# Patient Record
Sex: Male | Born: 1970 | Race: White | Hispanic: No | Smoking: Current every day smoker
Health system: Southern US, Community
[De-identification: ages and names within clinical notes are randomized; demographics above are authoritative.]

## PROBLEM LIST (undated history)

## (undated) HISTORY — PX: APPENDECTOMY: SHX54

---

## 2013-12-16 ENCOUNTER — Emergency Department (HOSPITAL_COMMUNITY)
Admission: EM | Admit: 2013-12-16 | Discharge: 2013-12-16 | Disposition: A | Discharge status: Home / Self Care | Payer: Self-pay | Attending: Emergency Medicine | Admitting: Emergency Medicine

## 2013-12-16 ENCOUNTER — Emergency Department (HOSPITAL_COMMUNITY): Payer: Self-pay

## 2013-12-16 ENCOUNTER — Encounter (HOSPITAL_COMMUNITY): Payer: Self-pay | Admitting: Emergency Medicine

## 2013-12-16 DIAGNOSIS — S81811A Laceration without foreign body, right lower leg, initial encounter: Secondary | ICD-10-CM

## 2013-12-16 DIAGNOSIS — S81009A Unspecified open wound, unspecified knee, initial encounter: Secondary | ICD-10-CM | POA: Insufficient documentation

## 2013-12-16 DIAGNOSIS — S62101A Fracture of unspecified carpal bone, right wrist, initial encounter for closed fracture: Secondary | ICD-10-CM

## 2013-12-16 DIAGNOSIS — Y929 Unspecified place or not applicable: Secondary | ICD-10-CM | POA: Insufficient documentation

## 2013-12-16 DIAGNOSIS — Y939 Activity, unspecified: Secondary | ICD-10-CM | POA: Insufficient documentation

## 2013-12-16 DIAGNOSIS — S52599A Other fractures of lower end of unspecified radius, initial encounter for closed fracture: Secondary | ICD-10-CM | POA: Insufficient documentation

## 2013-12-16 DIAGNOSIS — S91009A Unspecified open wound, unspecified ankle, initial encounter: Secondary | ICD-10-CM

## 2013-12-16 DIAGNOSIS — W11XXXA Fall on and from ladder, initial encounter: Secondary | ICD-10-CM | POA: Insufficient documentation

## 2013-12-16 DIAGNOSIS — F17200 Nicotine dependence, unspecified, uncomplicated: Secondary | ICD-10-CM | POA: Insufficient documentation

## 2013-12-16 DIAGNOSIS — S81809A Unspecified open wound, unspecified lower leg, initial encounter: Secondary | ICD-10-CM

## 2013-12-16 MED ORDER — ONDANSETRON HCL 4 MG PO TABS
4.0000 mg | ORAL_TABLET | Freq: Once | ORAL | Status: AC
  Administered 2013-12-16: 4 mg via ORAL
  Filled 2013-12-16: qty 1

## 2013-12-16 MED ORDER — DICLOFENAC SODIUM 75 MG PO TBEC: 75.0000 mg | DELAYED_RELEASE_TABLET | Freq: Two times a day (BID) | ORAL | Status: DC

## 2013-12-16 MED ORDER — OXYCODONE-ACETAMINOPHEN 5-325 MG PO TABS
1.0000 | ORAL_TABLET | Freq: Once | ORAL | Status: AC
  Administered 2013-12-16: 1 via ORAL
  Filled 2013-12-16: qty 1

## 2013-12-16 MED ORDER — KETOROLAC TROMETHAMINE 10 MG PO TABS
10.0000 mg | ORAL_TABLET | Freq: Once | ORAL | Status: AC
  Administered 2013-12-16: 10 mg via ORAL
  Filled 2013-12-16: qty 1

## 2013-12-16 MED ORDER — OXYCODONE-ACETAMINOPHEN 5-325 MG PO TABS: 1.0000 | ORAL_TABLET | Freq: Four times a day (QID) | ORAL | Status: DC | PRN

## 2013-12-16 MED ORDER — BACITRACIN-NEOMYCIN-POLYMYXIN 400-5-5000 EX OINT
TOPICAL_OINTMENT | Freq: Once | CUTANEOUS | Status: AC
  Administered 2013-12-16: 19:00:00 via TOPICAL
  Filled 2013-12-16: qty 1

## 2013-12-16 NOTE — Discharge Instructions (Signed)
Your x-ray reveals a fracture of the right wrist. Please leave the splint in place until seen by orthopedics. It is important that you are evaluated by orthopedics for proper healing of this fracture. Please apply ice over the next few days. Please use the sling to keep the arm elevated. Please use diclofenac 2 times daily with food for inflammation and swelling. May use Percocet if needed for pain. Percocet may cause pain, and or constipation. Please use with caution. Please see Dr Hilda LiasKeeling on 7/22 or 23.  Wrist Fracture A wrist fracture is a break in one of the bones of the wrist. Your wrist is made up of several small bones at the palm of your hand (carpal bones) and the two bones that make up your forearm (radius and ulna). The bones come together to form multiple large and small joints. The shape and design of these joints allow your wrist to bend and straighten, move side-to-side, and rotate, as in twisting your palm up or down. CAUSES  A fracture may occur in any of the bones in your wrist when enough force is applied to the wrist, such as when falling down onto an outstretched hand. Severe injuries may occur from a more forceful injury. SYMPTOMS Symptoms of wrist fractures include tenderness, bruising, and swelling. Additionally, the wrist may hang in an odd position or may be misshaped. DIAGNOSIS To diagnose a wrist fracture, your caregiver will physically examine your wrist. Your caregiver may also request an X-ray exam of your wrist. TREATMENT Treatment depends on many factors, including the nature and location of the fracture, your age, and your activity level. Treatment for wrist fracture can be nonsurgical or surgical. For nonsurgical treatment, a plaster cast or splint may be applied to your wrist if the bone is in a good position (aligned). The cast will stay on for about 6 weeks. If the alignment of your bone is not good, it may be necessary to realign (reduce) it. After the bone is  reduced, a splint usually is placed on your wrist to allow for a small amount of normal swelling. After about 1 week, the splint is removed and a cast is added. The cast is removed 2 or 3 weeks later, after the swelling goes down, causing the cast to loosen. Another cast is applied. This cast is removed after about another 2 or 3 weeks, for a total of 4 to 6 weeks of immobilization. Sometimes the position of the bone is so far out of place that surgery is required to apply a device to hold it together as it heals. If the bone cannot be reduced without cutting the skin around the bone (closed reduction), a cut (incision) is made to allow direct access to the bone to reduce it (open reduction). Depending on the fracture, there are a number of options for holding the bone in place while it heals, including a cast, metal pins, a plate and screws, and a device called an external fixator. With an external fixator, most of the hardware remains outside of the body. HOME CARE INSTRUCTIONS  To lessen swelling, keep your injured wrist elevated and move your fingers as much as possible.  Apply ice to your wrist for the first 1 to 2 days after you have been treated or as directed by your caregiver. Applying ice helps to reduce inflammation and pain.  Put ice in a plastic bag.  Place a towel between your skin and the bag.  Leave the ice on for 15  to 20 minutes at a time, every 2 hours while you are awake.  Do not put pressure on any part of your cast or splint. It may break.  Use a plastic bag to protect your cast or splint from water while bathing or showering. Do not lower your cast or splint into water.  Only take over-the-counter or prescription medicines for pain as directed by your caregiver. SEEK IMMEDIATE MEDICAL CARE IF:   Your cast or splint gets damaged or breaks.  You have continued severe pain or more swelling than you did before the cast was put on.  Your skin or fingernails below the injury  turn blue or gray or feel cold or numb.  You develop decreased feeling in your fingers. MAKE SURE YOU:  Understand these instructions.  Will watch your condition.  Will get help right away if you are not doing well or get worse. Document Released: 02/22/2005 Document Revised: 08/07/2011 Document Reviewed: 06/02/2011 [facility] Patient Information 2015 [place], [place]. This information is not intended to replace advice given to you by your health care provider. Make sure you discuss any questions you have with your health care provider.

## 2013-12-16 NOTE — ED Provider Notes (Signed)
CSN: [id number]     Arrival date & time 12/16/13  1657 History  This chart was scribed for Hurman Horn, MD,  by Ashley Jacobs, ED Scribe. The patient was seen in room APFT24/APFT24 and the patient's care was started at 5:58 PM.   First MD Initiated Contact with Patient 12/16/13 1736     Chief Complaint  Patient presents with  . Wrist Injury     (Consider location/radiation/quality/duration/timing/severity/associated sxs/prior Treatment) Patient is a 43 y.o. male presenting with wrist injury. The history is provided by the patient and medical records. No language interpreter was used.  Wrist Injury Location:  Wrist Injury: yes   Mechanism of injury: fall   Fall:    Fall occurred:  From a ladder   Entrapped after fall: no   Wrist location:  R wrist Foreign body present:  No foreign bodies Worsened by:  Movement Ineffective treatments:  None tried  HPI Comments: Bryce Crawford is a 43 y.o. male who presents to the Emergency Department complaining of wrist pain and swelling onset earlier today after falling off a ladder. The pain is worse with movement.  Denies LOC and head injury. Pt mentions having a laceration to his right knee. Nothing seems to make the pain better. Denies taking any blood thinners. His last tetanus shot was within the last five years. Pt does not have a current orthopedist. Pt does not have any known allergies to medications.  History reviewed. No pertinent past medical history. Past Surgical History  Procedure Laterality Date  . Appendectomy     History reviewed. No pertinent family history. History  Substance Use Topics  . Smoking status: Current Every Day Smoker  . Smokeless tobacco: Not on file  . Alcohol Use: No    Review of Systems  Musculoskeletal: Positive for arthralgias, joint swelling and myalgias.  Skin: Positive for wound.  Neurological: Negative for numbness.  Hematological: Does not bruise/bleed easily.  All other systems reviewed and  are negative.     Allergies  Review of patient's allergies indicates no known allergies.  Home Medications   Prior to Admission medications   Not on File   BP 125/83  Pulse 77  Temp(Src) 98.7 F (37.1 C) (Oral)  Resp 18  SpO2 97% Physical Exam  Nursing note and vitals reviewed. Constitutional: He is oriented to person, place, and time. He appears well-developed and well-nourished.  HENT:  Head: Normocephalic.  Eyes: Pupils are equal, round, and reactive to light.  Neck: Normal range of motion.  Cardiovascular: Normal rate.   Pulmonary/Chest: Effort normal.  Musculoskeletal: He exhibits tenderness.  Full ROM of the finger of the right hand. Capillary refill is less than 5 seconds.  Radial pulse is 2+. Tenderness to the distal radius.  Mild deformity of the distal radius.  No deformity of the ulnar area.  No deformity of the right forearm.  No deformity or effusion of the elbow.    Neurological: He is alert and oriented to person, place, and time.  Skin: Skin is warm and dry. He is not diaphoretic.  8 cm laceration/ abrasion of the right lower leg of the medial knee area.     ED Course FRACTURE CARE.   Patient sustained a fall from a ladder earlier today. His x-ray reveals a right distal radius fracture. The x-ray findings of the fracture were shared with the patient in terms which he understands. The procedure for splinting was then described to the patient in terms which he understood. Questions  answered.  The patient is noted to have 2+ radial pulse and capillary refill is less than 2 seconds. There no gross neurologic deficits appreciated. And sugar tong splint applied. Sling applied. Ice pack and pain medication provided. After the sugar tong was applied, there no temperature changes or discoloration appreciated. The capillary refill remains less than 2 seconds. The patient tolerated the procedure without problem.   Procedures (including critical care  time) DIAGNOSTIC STUDIES: Oxygen Saturation is 97% on room air, normal by my interpretation.    COORDINATION OF CARE:  6:04 PM Discussed course of care with pt which includes a consult with ortho surgeon. Pt understands and agrees.   Labs Review Labs Reviewed - No data to display  Imaging Review No results found.   EKG Interpretation None      MDM Patient sustained a fall from a ladder and injury to the right wrist. X-ray reveals a nondisplaced fracture of the distal radius. There no neurovascular compromise is appreciated. The patient has a sugar tong splint and sling applied. He will followup with Dr. Hilda LiasKeeling on July 22 or 23rd. Prescription for diclofenac and Percocet given to the patient.  The laceration to the right lower extremity is not a candidate for suture repair. The patient will cleanse the wound daily with soap and water, and apply nonstick dressing until the wound heals.    Final diagnoses:  None    *I have reviewed nursing notes, vital signs, and all appropriate lab and imaging results for this patient.** **I personally performed the services described in this documentation, which was scribed in my presence. The recorded information has been reviewed and is accurate.Kathie Dike*   Kamsiyochukwu Buist M Anik Wesch, PA-C 12/16/13 1821  Kathie DikeHobson M Ariani Seier, PA-C 12/16/13 Rickey [id number]

## 2013-12-16 NOTE — ED Notes (Signed)
Pt c/o right wrist pain after falling off a ladder earlier today. Pt denies hitting head, denies LOC. Pt has minimal movement with right fingers. Swelling noted to right wrist. Pt also reports "a gash" to his right knee.

## 2013-12-16 NOTE — ED Notes (Signed)
Pt verbalized understanding of no driving within 4 hours of taking pain med due to med causes drowsiness  

## 2013-12-20 ENCOUNTER — Emergency Department (HOSPITAL_COMMUNITY)
Admission: EM | Admit: 2013-12-20 | Discharge: 2013-12-20 | Disposition: A | Discharge status: Home / Self Care | Payer: Self-pay | Attending: Emergency Medicine | Admitting: Emergency Medicine

## 2013-12-20 ENCOUNTER — Emergency Department (HOSPITAL_COMMUNITY): Payer: Self-pay

## 2013-12-20 ENCOUNTER — Encounter (HOSPITAL_COMMUNITY): Payer: Self-pay | Admitting: Emergency Medicine

## 2013-12-20 DIAGNOSIS — M25519 Pain in unspecified shoulder: Secondary | ICD-10-CM | POA: Insufficient documentation

## 2013-12-20 DIAGNOSIS — F17200 Nicotine dependence, unspecified, uncomplicated: Secondary | ICD-10-CM | POA: Insufficient documentation

## 2013-12-20 DIAGNOSIS — M25579 Pain in unspecified ankle and joints of unspecified foot: Secondary | ICD-10-CM | POA: Insufficient documentation

## 2013-12-20 DIAGNOSIS — S91009A Unspecified open wound, unspecified ankle, initial encounter: Secondary | ICD-10-CM

## 2013-12-20 DIAGNOSIS — S81009A Unspecified open wound, unspecified knee, initial encounter: Secondary | ICD-10-CM | POA: Insufficient documentation

## 2013-12-20 DIAGNOSIS — Z792 Long term (current) use of antibiotics: Secondary | ICD-10-CM | POA: Insufficient documentation

## 2013-12-20 DIAGNOSIS — M25561 Pain in right knee: Secondary | ICD-10-CM

## 2013-12-20 DIAGNOSIS — Y929 Unspecified place or not applicable: Secondary | ICD-10-CM | POA: Insufficient documentation

## 2013-12-20 DIAGNOSIS — Y939 Activity, unspecified: Secondary | ICD-10-CM | POA: Insufficient documentation

## 2013-12-20 DIAGNOSIS — M25511 Pain in right shoulder: Secondary | ICD-10-CM

## 2013-12-20 DIAGNOSIS — G8911 Acute pain due to trauma: Secondary | ICD-10-CM | POA: Insufficient documentation

## 2013-12-20 DIAGNOSIS — S81809A Unspecified open wound, unspecified lower leg, initial encounter: Secondary | ICD-10-CM

## 2013-12-20 DIAGNOSIS — Z23 Encounter for immunization: Secondary | ICD-10-CM | POA: Insufficient documentation

## 2013-12-20 DIAGNOSIS — M79671 Pain in right foot: Secondary | ICD-10-CM

## 2013-12-20 DIAGNOSIS — M25571 Pain in right ankle and joints of right foot: Secondary | ICD-10-CM

## 2013-12-20 DIAGNOSIS — W11XXXA Fall on and from ladder, initial encounter: Secondary | ICD-10-CM | POA: Insufficient documentation

## 2013-12-20 DIAGNOSIS — Z791 Long term (current) use of non-steroidal anti-inflammatories (NSAID): Secondary | ICD-10-CM | POA: Insufficient documentation

## 2013-12-20 MED ORDER — TETANUS-DIPHTH-ACELL PERTUSSIS 5-2.5-18.5 LF-MCG/0.5 IM SUSP
0.5000 mL | Freq: Once | INTRAMUSCULAR | Status: AC
  Administered 2013-12-20: 0.5 mL via INTRAMUSCULAR
  Filled 2013-12-20: qty 0.5

## 2013-12-20 MED ORDER — SULFAMETHOXAZOLE-TMP DS 800-160 MG PO TABS
1.0000 | ORAL_TABLET | Freq: Once | ORAL | Status: AC
  Administered 2013-12-20: 1 via ORAL
  Filled 2013-12-20: qty 1

## 2013-12-20 MED ORDER — SULFAMETHOXAZOLE-TRIMETHOPRIM 800-160 MG PO TABS: 1.0000 | ORAL_TABLET | Freq: Two times a day (BID) | ORAL | Status: DC

## 2013-12-20 MED ORDER — NAPROXEN 500 MG PO TABS: 500.0000 mg | ORAL_TABLET | Freq: Two times a day (BID) | ORAL | Status: DC

## 2013-12-20 NOTE — ED Provider Notes (Signed)
CSN: [id number]     Arrival date & time 12/20/13  2000 History   First MD Initiated Contact with Patient 12/20/13 2038    This chart was scribed for Donnetta HutchingBrian Rihan Schueler, MD by Marica OtterNusrat Rahman, ED Scribe. This patient was seen in room APA09/APA09 and the patient's care was started at 8:46 PM.  Chief Complaint  Patient presents with  . Ankle Pain  PCP: No PCP Per Patient   The history is provided by the patient. No language interpreter was used.   HPI Comments: Bryce Crawford is a 43 y.o. male who presents to the Emergency Department complaining of right wrist pain due to a fall sustained on 12/16/13. Pt now complains of swelling and pain to his right ankle. Pt rates his ankle pain a 10 out of 10. However, pt notes that he has been ambulatory since the fall. Pt further reports that he is unsure whether he is UTD on his tetanus vaccines.   Pt presented to the ED on 12/16/13 where imaging revealed nondisplaced fracture of the distal radius; and a sugar tong splint and sling were applied. During said ED visit, pt was also referred to Dr. Hilda LiasKeeling for a f/u appointment on 12/17/13 or 12/18/13. Pt was also given percocet during said ED visit. Pt reports he did not f/u with Dr. Hilda LiasKeeling due to costs.  History reviewed. No pertinent past medical history. Past Surgical History  Procedure Laterality Date  . Appendectomy     No family history on file. History  Substance Use Topics  . Smoking status: Current Every Day Smoker  . Smokeless tobacco: Not on file  . Alcohol Use: No    Review of Systems A complete 10 system review of systems was obtained and all systems are negative except as noted in the HPI and PMH.     Allergies  Review of patient's allergies indicates no known allergies.  Home Medications   Prior to Admission medications   Medication Sig Start Date End Date Taking? Authorizing Provider  diclofenac (VOLTAREN) 75 MG EC tablet Take 1 tablet (75 mg total) by mouth 2 (two) times daily. 12/16/13   Yes Kathie DikeHobson M Bryant, PA-C  oxyCODONE-acetaminophen (PERCOCET/ROXICET) 5-325 MG per tablet Take 1 tablet by mouth every 6 (six) hours as needed. 12/16/13  Yes Kathie DikeHobson M Bryant, PA-C  naproxen (NAPROSYN) 500 MG tablet Take 1 tablet (500 mg total) by mouth 2 (two) times daily. 12/20/13   Donnetta HutchingBrian Shondale Quinley, MD  sulfamethoxazole-trimethoprim (SEPTRA DS) 800-160 MG per tablet Take 1 tablet by mouth 2 (two) times daily. 12/20/13   Donnetta HutchingBrian Anairis Knick, MD   Triage Vitals: BP 127/83  Pulse 98  Temp(Src) 98 F (36.7 C) (Oral)  Resp 20  Ht 6\' 4"  (1.93 m)  Wt 184 lb (83.462 kg)  BMI 22.41 kg/m2  SpO2 99% Physical Exam  Nursing note and vitals reviewed. Constitutional: He is oriented to person, place, and time. He appears well-developed and well-nourished.  HENT:  Head: Normocephalic and atraumatic.  Eyes: Conjunctivae and EOM are normal. Pupils are equal, round, and reactive to light.  Neck: Normal range of motion. Neck supple.  Cardiovascular: Normal rate, regular rhythm and normal heart sounds.   Pulmonary/Chest: Effort normal and breath sounds normal.  Abdominal: Soft. Bowel sounds are normal.  Musculoskeletal: He exhibits tenderness.  Tender peri right ankle and dorsum of the right foot.   Neurological: He is alert and oriented to person, place, and time.  Skin: Skin is warm and dry.  Medial aspect of right  knee 6 cm elliptical superfical laceration.  Psychiatric: He has a normal mood and affect. His behavior is normal.    ED Course  Procedures (including critical care time) DIAGNOSTIC STUDIES: Oxygen Saturation is 99% on RA, nl by my interpretation.    COORDINATION OF CARE: 8:50 PM-Discussed treatment plan which includes imaging of the ankle and foot, tetanus shot, pain meds and antibiotics with pt at bedside and pt agreed to plan.   Labs Review Labs Reviewed - No data to display  Imaging Review Dg Shoulder Right  12/20/2013   CLINICAL DATA:  Right shoulder pain, status post fall from ladder.   EXAM: RIGHT SHOULDER - 2+ VIEW  COMPARISON:  None.  FINDINGS: There is no evidence of fracture or dislocation. Slight sclerotic change at the junction of the middle and lateral thirds of the right clavicle is thought to reflect remote injury. The right humeral head is seated within the glenoid fossa. The acromioclavicular joint is unremarkable in appearance. No significant soft tissue abnormalities are seen. The visualized portions of the right lung are clear.  IMPRESSION: No evidence of fracture or dislocation.   Electronically Signed   By: Roanna Raider M.D.   On: 12/20/2013 21:50   Dg Ankle Complete Right  12/20/2013   CLINICAL DATA:  Ankle swelling and bruising.  Fall from ladder.  EXAM: RIGHT ANKLE - COMPLETE 3+ VIEW  COMPARISON:  None.  FINDINGS: No evidence for fracture. No subluxation or dislocation. Ankle mortise is preserved. Soft tissue swelling is evident.  IMPRESSION: Soft tissue swelling without acute bony abnormality.   Electronically Signed   By: Kennith Center M.D.   On: 12/20/2013 21:49   Dg Knee Complete 4 Views Right  12/20/2013   CLINICAL DATA:  43 year old male right knee pain following injury  EXAM: RIGHT KNEE - COMPLETE 4+ VIEW  COMPARISON:  None.  FINDINGS: There is no evidence of acute fracture, subluxation or dislocation.  Chondrocalcinosis identified.  There is no evidence of joint effusion.  No focal bony lesions are identified.  IMPRESSION: No evidence of acute bony abnormality.  Chondrocalcinosis/ CPPD.   Electronically Signed   By: Laveda Abbe M.D.   On: 12/20/2013 21:48   Dg Foot Complete Right  12/20/2013   CLINICAL DATA:  Foot pain after fall from ladder.  Swelling.  EXAM: RIGHT FOOT COMPLETE - 3+ VIEW  COMPARISON:  None.  FINDINGS: There is no evidence of fracture or dislocation. There is no evidence of arthropathy or other focal bone abnormality. Soft tissues are unremarkable.  IMPRESSION: Negative.   Electronically Signed   By: Kennith Center M.D.   On: 12/20/2013 21:50      EKG Interpretation None      MDM   Final diagnoses:  Right ankle pain  Right foot pain  Right knee pain  Right shoulder pain   Plain films of right shoulder, right knee, right ankle, right foot all negative for fracture. Tetanus update. Will start Septra DS for mild cellulitis of right medial knee laceration. Also Rx for Naprosyn 500 mg. Ankle brace applied  I personally performed the services described in this documentation, which was scribed in my presence. The recorded information has been reviewed and is accurate.    Donnetta Hutching, MD 12/20/13 2201

## 2013-12-20 NOTE — ED Provider Notes (Signed)
Medical screening examination/treatment/procedure(s) were performed by non-physician practitioner and as supervising physician I was immediately available for consultation/collaboration.   EKG Interpretation None       Hyland Mollenkopf M Zadyn Yardley, MD 12/20/13 0121 

## 2013-12-20 NOTE — Discharge Instructions (Signed)
All x-rays were negative for fracture. Need to keep wound clean with soap and water. Simple dressing daily. Ice to painful areas. Ankle wrap. Antibiotic and pain medicine

## 2013-12-20 NOTE — ED Notes (Signed)
Seen in ed on 7/21. States he fell off a ladder, patient has a broken right wrist and was referred to Dr Hilda LiasKeeling and does not have the money to go. States he has rewrapped his arm splint and would like a dressing change on his right leg. Today his right ankle is swollen and he is concerned it may be broken

## 2015-04-02 ENCOUNTER — Emergency Department (HOSPITAL_COMMUNITY)
Admission: EM | Admit: 2015-04-02 | Discharge: 2015-04-02 | Disposition: A | Discharge status: Home / Self Care | Payer: Worker's Compensation | Attending: Emergency Medicine | Admitting: Emergency Medicine

## 2015-04-02 ENCOUNTER — Emergency Department (HOSPITAL_COMMUNITY): Payer: Worker's Compensation

## 2015-04-02 ENCOUNTER — Encounter (HOSPITAL_COMMUNITY): Payer: Self-pay | Admitting: Emergency Medicine

## 2015-04-02 DIAGNOSIS — Z720 Tobacco use: Secondary | ICD-10-CM | POA: Diagnosis not present

## 2015-04-02 DIAGNOSIS — Y9389 Activity, other specified: Secondary | ICD-10-CM | POA: Insufficient documentation

## 2015-04-02 DIAGNOSIS — S300XXA Contusion of lower back and pelvis, initial encounter: Secondary | ICD-10-CM | POA: Diagnosis not present

## 2015-04-02 DIAGNOSIS — S0240DA Maxillary fracture, left side, initial encounter for closed fracture: Secondary | ICD-10-CM | POA: Diagnosis not present

## 2015-04-02 DIAGNOSIS — S7011XA Contusion of right thigh, initial encounter: Secondary | ICD-10-CM

## 2015-04-02 DIAGNOSIS — Z792 Long term (current) use of antibiotics: Secondary | ICD-10-CM | POA: Diagnosis not present

## 2015-04-02 DIAGNOSIS — Y9289 Other specified places as the place of occurrence of the external cause: Secondary | ICD-10-CM | POA: Insufficient documentation

## 2015-04-02 DIAGNOSIS — Y998 Other external cause status: Secondary | ICD-10-CM | POA: Diagnosis not present

## 2015-04-02 DIAGNOSIS — S02401A Maxillary fracture, unspecified, initial encounter for closed fracture: Secondary | ICD-10-CM

## 2015-04-02 DIAGNOSIS — S7012XA Contusion of left thigh, initial encounter: Secondary | ICD-10-CM | POA: Diagnosis not present

## 2015-04-02 DIAGNOSIS — Z791 Long term (current) use of non-steroidal anti-inflammatories (NSAID): Secondary | ICD-10-CM | POA: Diagnosis not present

## 2015-04-02 DIAGNOSIS — S0993XA Unspecified injury of face, initial encounter: Secondary | ICD-10-CM | POA: Diagnosis present

## 2015-04-02 MED ORDER — IBUPROFEN 800 MG PO TABS
800.0000 mg | ORAL_TABLET | Freq: Once | ORAL | Status: AC
  Administered 2015-04-02: 800 mg via ORAL
  Filled 2015-04-02: qty 1

## 2015-04-02 MED ORDER — OXYCODONE-ACETAMINOPHEN 5-325 MG PO TABS: 1.0000 | ORAL_TABLET | ORAL | Status: AC | PRN

## 2015-04-02 NOTE — Discharge Instructions (Signed)
Take ibuprofen, naproxen, or acetaminophen for less severe pain.  General Assault Assault includes any behavior or physical attack--whether it is on purpose or not--that results in injury to another person, damage to property, or both. This also includes assault that has not yet happened, but is planned to happen. Threats of assault may be physical, verbal, or written. They may be said or sent by:  Mail.  E-mail.  Text.  Social media.  Fax. The threats may be direct, implied, or understood. WHAT ARE THE DIFFERENT FORMS OF ASSAULT? Forms of assault include:  Physically assaulting a person. This includes physical threats to inflict physical harm as well as:  Slapping.  Hitting.  Poking.  Kicking.  Punching.  Pushing.  Sexually assaulting a person. Sexual assault is any sexual activity that a person is forced, threatened, or coerced to participate in. It may or may not involve physical contact with the person who is assaulting you. You are sexually assaulted if you are forced to have sexual contact of any kind.  Damaging or destroying a person's assistive equipment, such as glasses, canes, or walkers.  Throwing or hitting objects.  Using or displaying a weapon to harm or threaten someone.  Using or displaying an object that appears to be a weapon in a threatening manner.  Using greater physical size or strength to intimidate someone.  Making intimidating or threatening gestures.  Bullying.  Hazing.  Using language that is intimidating, threatening, hostile, or abusive.  Stalking.  Restraining someone with force. WHAT SHOULD I DO IF I EXPERIENCE ASSAULT?  Report assaults, threats, and stalking to the police. Call your local emergency services (911 in the [place] if you are in immediate danger or you need medical help.  You can work with a Clinical research associate or an advocate to get legal protection against someone who has assaulted you or threatened you with assault. Protection  includes restraining orders and private addresses. Crimes against you, such as assault, can also be prosecuted through the courts. Laws will vary depending on where you live.   This information is not intended to replace advice given to you by your health care provider. Make sure you discuss any questions you have with your health care provider.   Document Released: 05/15/2005 Document Revised: 06/05/2014 Document Reviewed: 01/30/2014 Elsevier Interactive Patient Education 2016 [employer]  Contusion A contusion is a deep bruise. Contusions are the result of a blunt injury to tissues and muscle fibers under the skin. The injury causes bleeding under the skin. The skin overlying the contusion may turn blue, purple, or yellow. Minor injuries will give you a painless contusion, but more severe contusions may stay painful and swollen for a few weeks.  CAUSES  This condition is usually caused by a blow, trauma, or direct force to an area of the body. SYMPTOMS  Symptoms of this condition include:  Swelling of the injured area.  Pain and tenderness in the injured area.  Discoloration. The area may have redness and then turn blue, purple, or yellow. DIAGNOSIS  This condition is diagnosed based on a physical exam and medical history. An X-ray, CT scan, or MRI may be needed to determine if there are any associated injuries, such as broken bones (fractures). TREATMENT  Specific treatment for this condition depends on what area of the body was injured. In general, the best treatment for a contusion is resting, icing, applying pressure to (compression), and elevating the injured area. This is often called the RICE strategy. Over-the-counter anti-inflammatory medicines  may also be recommended for pain control.  HOME CARE INSTRUCTIONS   Rest the injured area.  If directed, apply ice to the injured area:  Put ice in a plastic bag.  Place a towel between your skin and the bag.  Leave the ice on  for 20 minutes, 2-3 times per day.  If directed, apply light compression to the injured area using an elastic bandage. Make sure the bandage is not wrapped too tightly. Remove and reapply the bandage as directed by your health care provider.  If possible, raise (elevate) the injured area above the level of your heart while you are sitting or lying down.  Take over-the-counter and prescription medicines only as told by your health care provider. SEEK MEDICAL CARE IF:  Your symptoms do not improve after several days of treatment.  Your symptoms get worse.  You have difficulty moving the injured area. SEEK IMMEDIATE MEDICAL CARE IF:   You have severe pain.  You have numbness in a hand or foot.  Your hand or foot turns pale or cold.   This information is not intended to replace advice given to you by your health care provider. Make sure you discuss any questions you have with your health care provider.   Document Released: 02/22/2005 Document Revised: 02/03/2015 Document Reviewed: 09/30/2014 Elsevier Interactive Patient Education 2016 [employer]  Acetaminophen; Oxycodone tablets What is this medicine? ACETAMINOPHEN; OXYCODONE (a set a MEE noe fen; ox i KOE done) is a pain reliever. It is used to treat moderate to severe pain. This medicine may be used for other purposes; ask your health care provider or pharmacist if you have questions. What should I tell my health care provider before I take this medicine? They need to know if you have any of these conditions: -brain tumor -Crohn's disease, inflammatory bowel disease, or ulcerative colitis -drug abuse or addiction -head injury -heart or circulation problems -if you often drink alcohol -kidney disease or problems going to the bathroom -liver disease -lung disease, asthma, or breathing problems -an unusual or allergic reaction to acetaminophen, oxycodone, other opioid analgesics, other medicines, foods, dyes, or  preservatives -pregnant or trying to get pregnant -breast-feeding How should I use this medicine? Take this medicine by mouth with a full glass of water. Follow the directions on the prescription label. You can take it with or without food. If it upsets your stomach, take it with food. Take your medicine at regular intervals. Do not take it more often than directed. Talk to your pediatrician regarding the use of this medicine in children. Special care may be needed. Patients over 44 years old may have a stronger reaction and need a smaller dose. Overdosage: If you think you have taken too much of this medicine contact a poison control center or emergency room at once. NOTE: This medicine is only for you. Do not share this medicine with others. What if I miss a dose? If you miss a dose, take it as soon as you can. If it is almost time for your next dose, take only that dose. Do not take double or extra doses. What may interact with this medicine? -alcohol -antihistamines -barbiturates like amobarbital, butalbital, butabarbital, methohexital, pentobarbital, phenobarbital, thiopental, and secobarbital -benztropine -drugs for bladder problems like solifenacin, trospium, oxybutynin, tolterodine, hyoscyamine, and methscopolamine -drugs for breathing problems like ipratropium and tiotropium -drugs for certain stomach or intestine problems like propantheline, homatropine methylbromide, glycopyrrolate, atropine, belladonna, and dicyclomine -general anesthetics like etomidate, ketamine, nitrous oxide, propofol, desflurane,  enflurane, halothane, isoflurane, and sevoflurane -medicines for depression, anxiety, or psychotic disturbances -medicines for sleep -muscle relaxants -naltrexone -narcotic medicines (opiates) for pain -phenothiazines like perphenazine, thioridazine, chlorpromazine, mesoridazine, fluphenazine, prochlorperazine, promazine, and  trifluoperazine -scopolamine -tramadol -trihexyphenidyl This list may not describe all possible interactions. Give your health care provider a list of all the medicines, herbs, non-prescription drugs, or dietary supplements you use. Also tell them if you smoke, drink alcohol, or use illegal drugs. Some items may interact with your medicine. What should I watch for while using this medicine? Tell your doctor or health care professional if your pain does not go away, if it gets worse, or if you have new or a different type of pain. You may develop tolerance to the medicine. Tolerance means that you will need a higher dose of the medication for pain relief. Tolerance is normal and is expected if you take this medicine for a long time. Do not suddenly stop taking your medicine because you may develop a severe reaction. Your body becomes used to the medicine. This does NOT mean you are addicted. Addiction is a behavior related to getting and using a drug for a non-medical reason. If you have pain, you have a medical reason to take pain medicine. Your doctor will tell you how much medicine to take. If your doctor wants you to stop the medicine, the dose will be slowly lowered over time to avoid any side effects. You may get drowsy or dizzy. Do not drive, use machinery, or do anything that needs mental alertness until you know how this medicine affects you. Do not stand or sit up quickly, especially if you are an older patient. This reduces the risk of dizzy or fainting spells. Alcohol may interfere with the effect of this medicine. Avoid alcoholic drinks. There are different types of narcotic medicines (opiates) for pain. If you take more than one type at the same time, you may have more side effects. Give your health care provider a list of all medicines you use. Your doctor will tell you how much medicine to take. Do not take more medicine than directed. Call emergency for help if you have problems  breathing. The medicine will cause constipation. Try to have a bowel movement at least every 2 to 3 days. If you do not have a bowel movement for 3 days, call your doctor or health care professional. Do not take Tylenol (acetaminophen) or medicines that have acetaminophen with this medicine. Too much acetaminophen can be very dangerous. Many nonprescription medicines contain acetaminophen. Always read the labels carefully to avoid taking more acetaminophen. What side effects may I notice from receiving this medicine? Side effects that you should report to your doctor or health care professional as soon as possible: -allergic reactions like skin rash, itching or hives, swelling of the face, lips, or tongue -breathing difficulties, wheezing -confusion -light headedness or fainting spells -severe stomach pain -unusually weak or tired -yellowing of the skin or the whites of the eyes Side effects that usually do not require medical attention (report to your doctor or health care professional if they continue or are bothersome): -dizziness -drowsiness -nausea -vomiting This list may not describe all possible side effects. Call your doctor for medical advice about side effects. You may report side effects to FDA at 1-800-FDA-1088. Where should I keep my medicine? Keep out of the reach of children. This medicine can be abused. Keep your medicine in a safe place to protect it from theft. Do not share  this medicine with anyone. Selling or giving away this medicine is dangerous and against the law. This medicine may cause accidental overdose and death if it taken by other adults, children, or pets. Mix any unused medicine with a substance like cat litter or coffee grounds. Then throw the medicine away in a sealed container like a sealed bag or a coffee can with a lid. Do not use the medicine after the expiration date. Store at room temperature between 20 and 25 degrees C (68 and 77 degrees F). NOTE: This  sheet is a summary. It may not cover all possible information. If you have questions about this medicine, talk to your doctor, pharmacist, or health care provider.    2016, [employer]. (2014-04-15 15:18:46)
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
## 2016-03-02 ENCOUNTER — Other Ambulatory Visit: Payer: Self-pay | Admitting: Physician Assistant
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
## 2016-06-05 ENCOUNTER — Other Ambulatory Visit: Payer: Self-pay | Admitting: Physician Assistant
